# Patient Record
Sex: Male | Born: 1970 | Race: Black or African American | Hispanic: No | Marital: Single | Smoking: Never smoker
Health system: Southern US, Community
[De-identification: ages and names within clinical notes are randomized; demographics above are authoritative.]

---

## 1999-05-09 ENCOUNTER — Emergency Department (HOSPITAL_COMMUNITY): Admission: EM | Admit: 1999-05-09 | Discharge: 1999-05-09 | Payer: Self-pay | Admitting: Emergency Medicine

## 1999-11-07 ENCOUNTER — Encounter: Payer: Self-pay | Admitting: Emergency Medicine

## 1999-11-07 ENCOUNTER — Emergency Department (HOSPITAL_COMMUNITY): Admission: EM | Admit: 1999-11-07 | Discharge: 1999-11-07 | Payer: Self-pay | Admitting: Internal Medicine

## 2004-09-21 ENCOUNTER — Encounter: Admission: RE | Admit: 2004-09-21 | Discharge: 2004-09-21 | Payer: Self-pay | Admitting: Family Medicine

## 2006-04-21 IMAGING — CR DG FOREARM 2V*L*
2 series · 2 of 2 positions shown · non-contrast
Comparison: none

CLINICAL DATA: Patient has left forearm pain.  
 LEFT FOREARM - TWO VIEW:
 AP and lateral views reveal no fracture.

[view not recorded (1 of 2)]
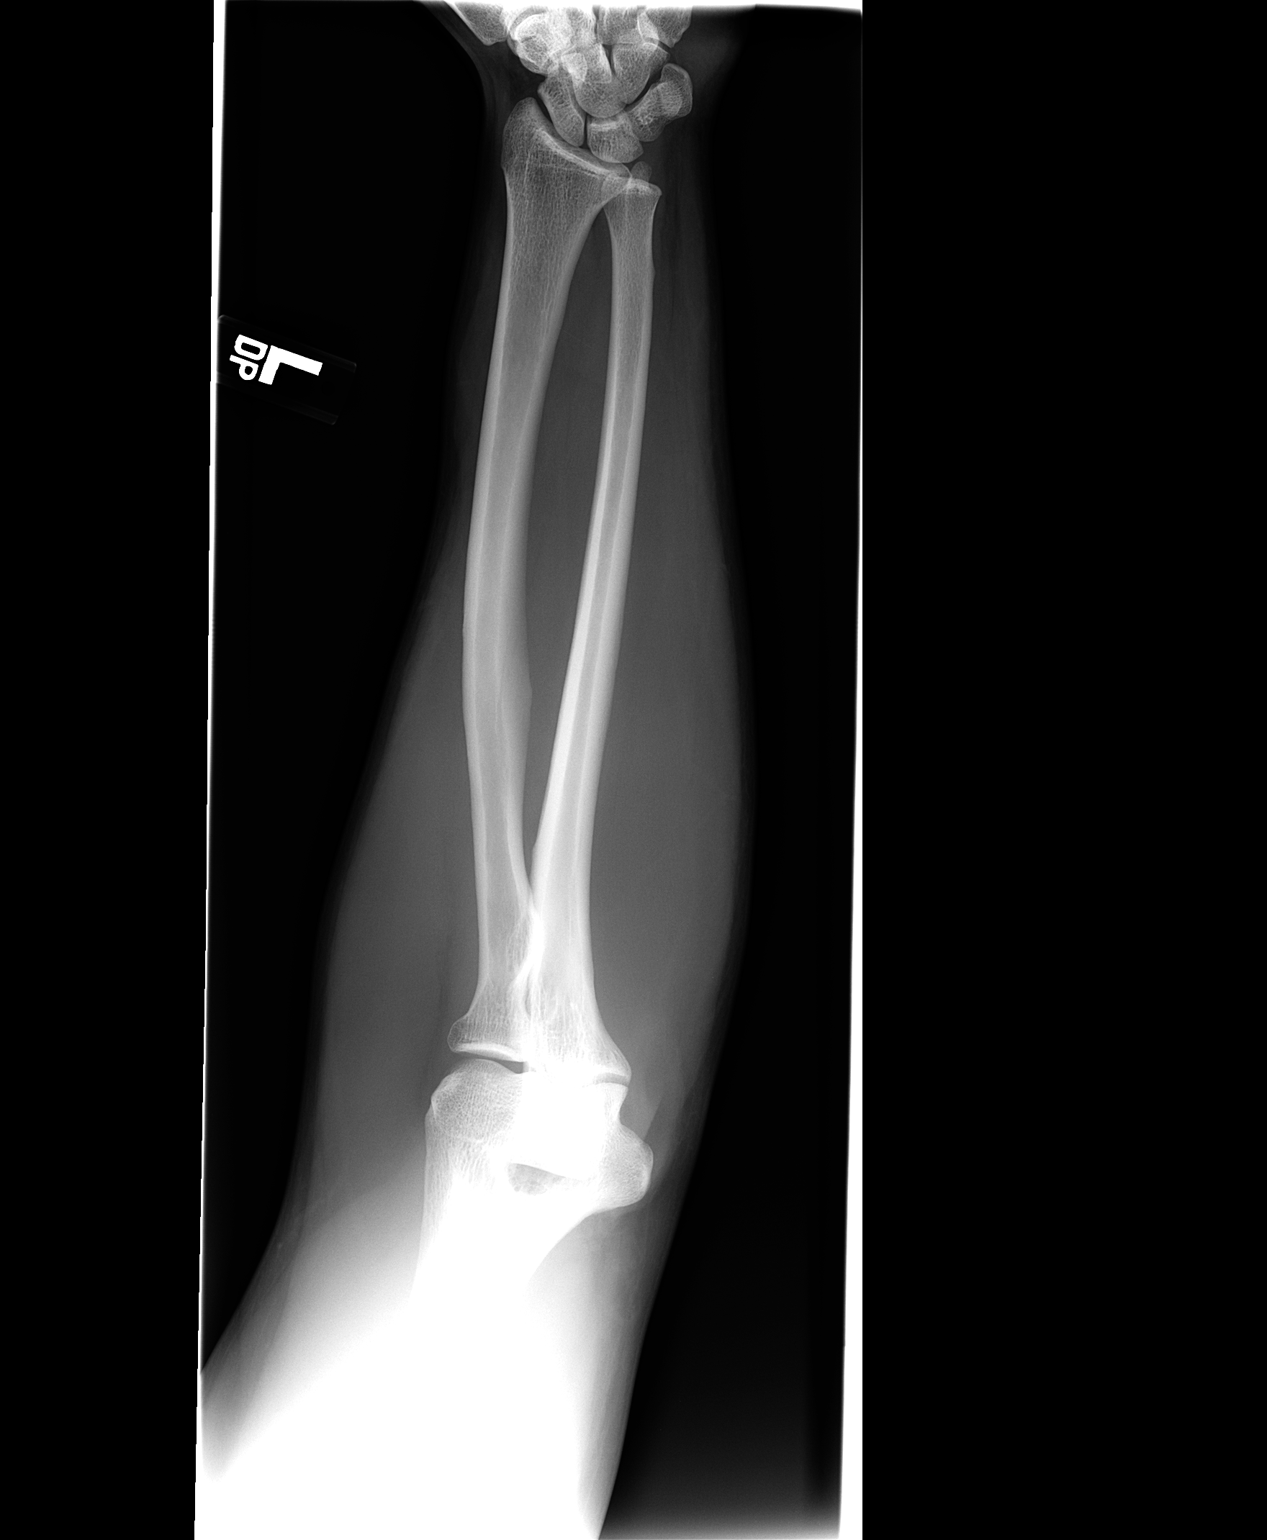

[view not recorded (2 of 2)]
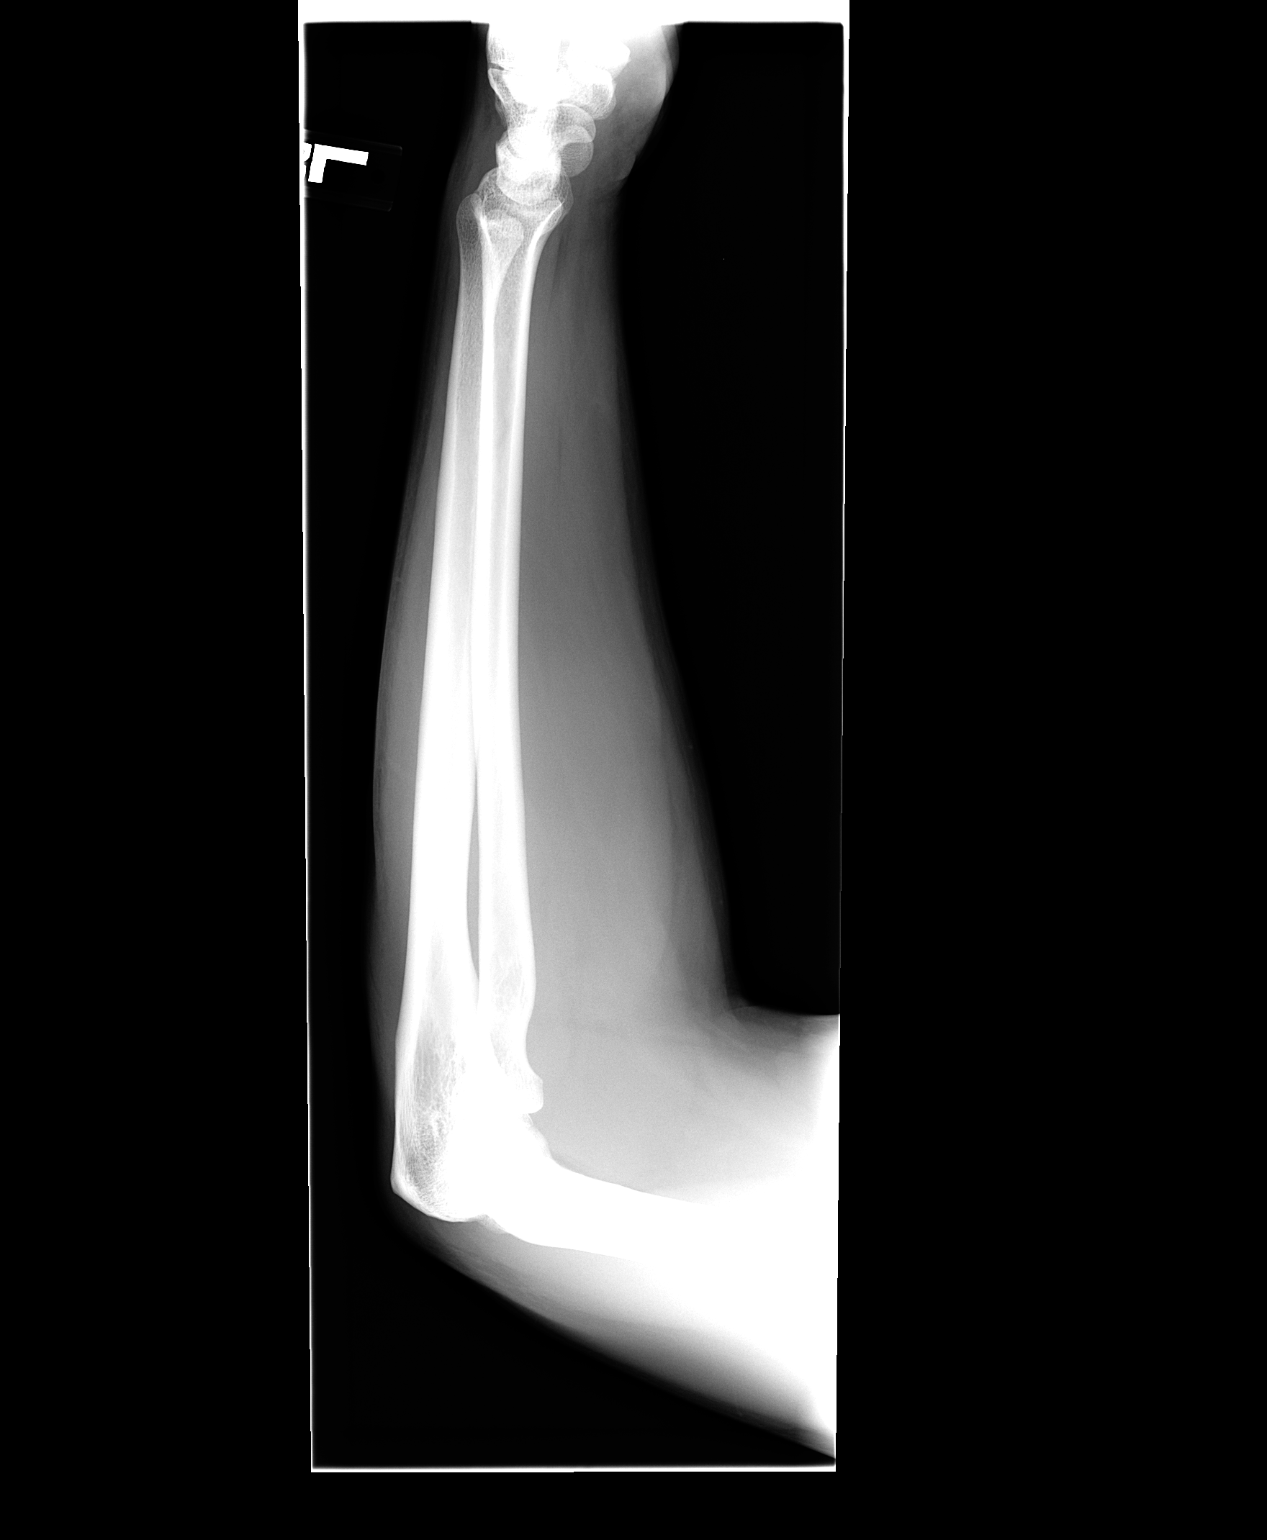

[2 of 2 positions shown; findings below may reference images not displayed]

IMPRESSION: No abnormality.

## 2009-12-19 ENCOUNTER — Encounter
Admission: RE | Admit: 2009-12-19 | Discharge: 2009-12-19 | Payer: Self-pay | Admitting: Physical Medicine and Rehabilitation

## 2011-07-19 IMAGING — CR DG CHEST 1V
1 series · 1 of 1 positions shown · non-contrast
Comparison: None.

CLINICAL DATA: Physical exam

CHEST - 1 VIEW

[w chest pa]
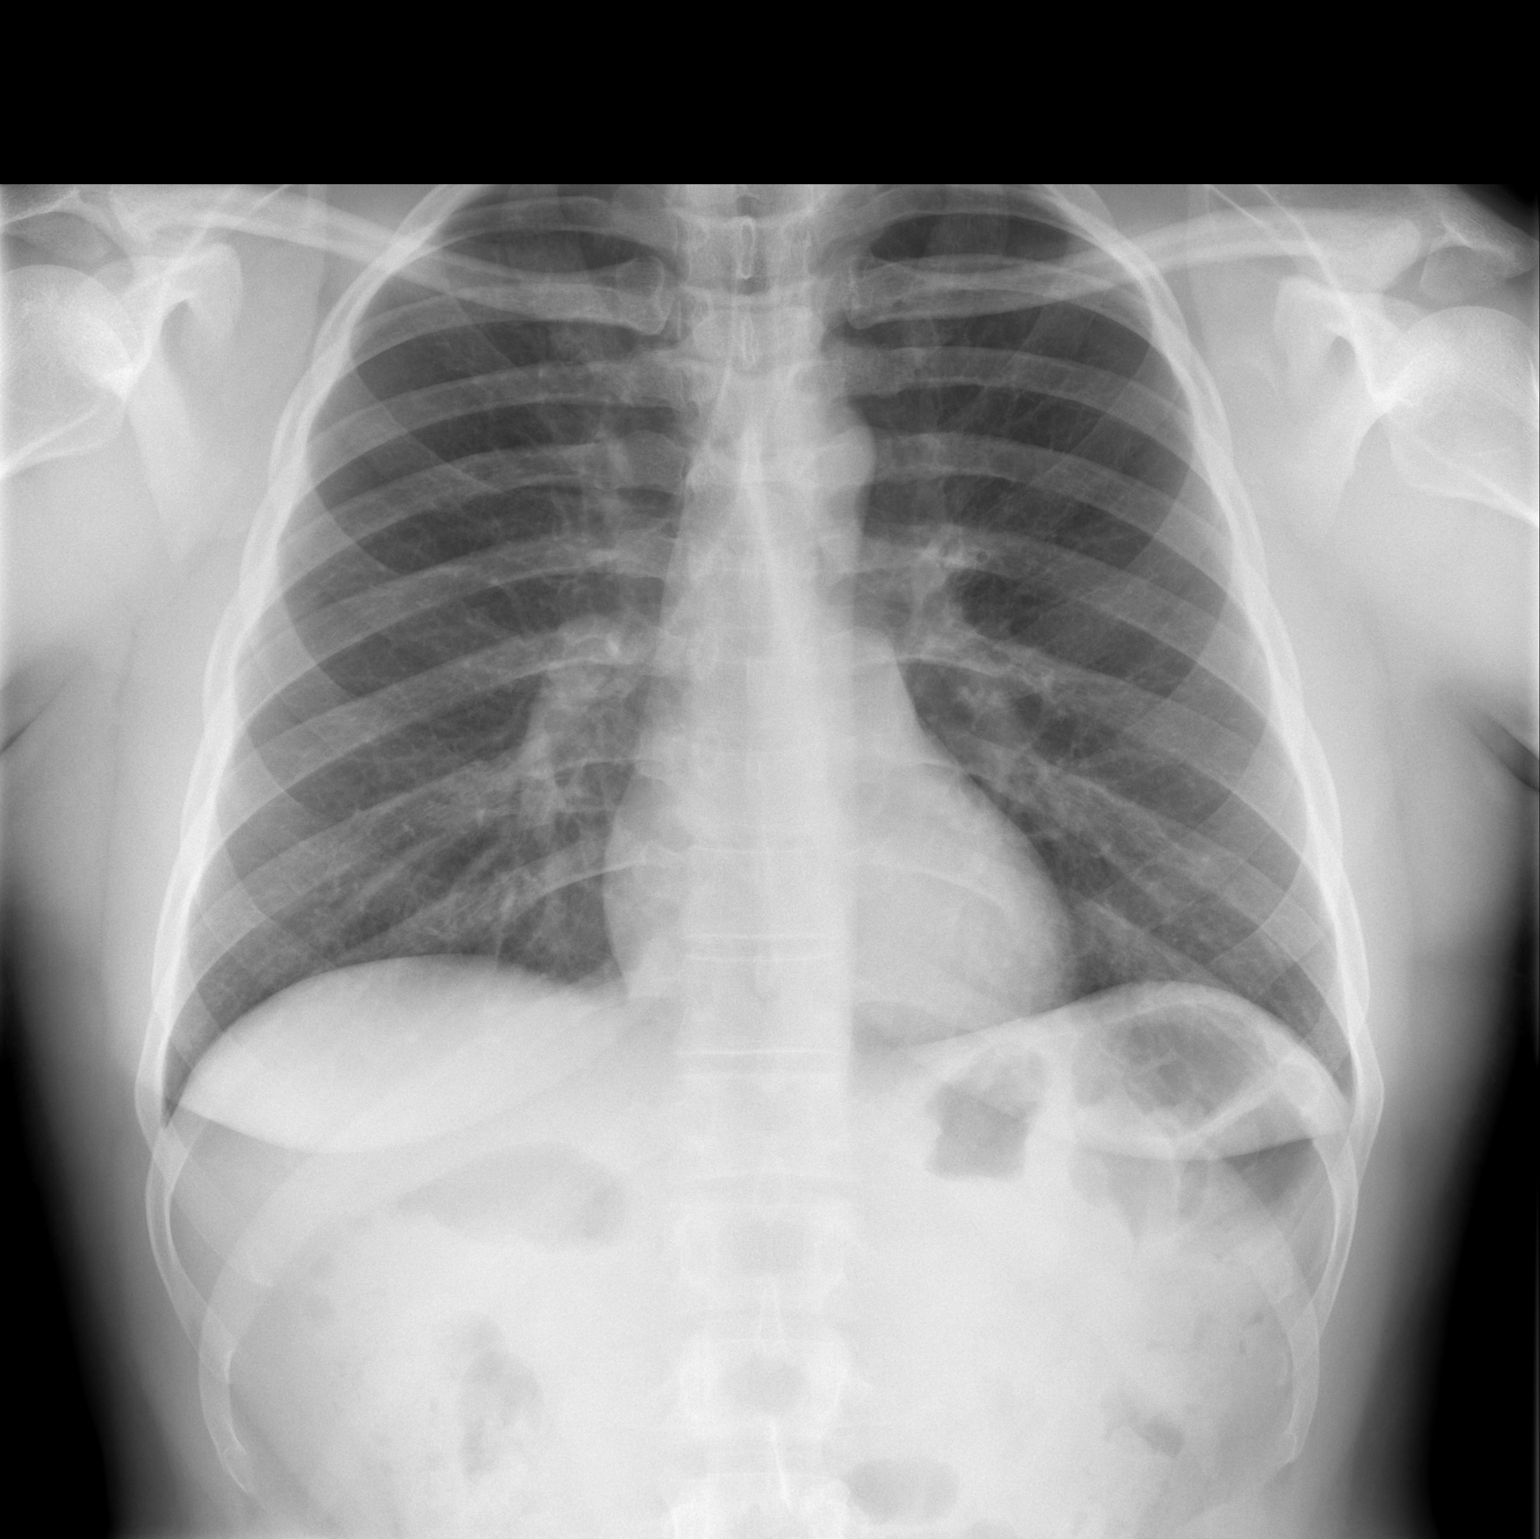

[1 of 1 positions shown; findings below may reference images not displayed]

FINDINGS: The lungs are clear.  Mediastinal contours are normal.
The heart is within normal limits in size.  There is mild
gynecomastia present.  No bony abnormality is seen.
IMPRESSION: No active lung disease.

## 2018-07-29 ENCOUNTER — Ambulatory Visit: Payer: 59 | Admitting: Podiatry

## 2018-08-14 ENCOUNTER — Ambulatory Visit: Payer: 59 | Admitting: Podiatry

## 2018-08-25 ENCOUNTER — Ambulatory Visit: Payer: 59 | Admitting: Podiatry

## 2018-09-25 ENCOUNTER — Ambulatory Visit: Payer: 59 | Admitting: Podiatry

## 2019-01-14 ENCOUNTER — Ambulatory Visit
Admission: EM | Admit: 2019-01-14 | Discharge: 2019-01-14 | Disposition: A | Discharge status: Home / Self Care | Payer: 59 | Attending: Physician Assistant | Admitting: Physician Assistant

## 2019-01-14 DIAGNOSIS — R1032 Left lower quadrant pain: Secondary | ICD-10-CM

## 2019-01-14 DIAGNOSIS — K219 Gastro-esophageal reflux disease without esophagitis: Secondary | ICD-10-CM

## 2019-01-14 DIAGNOSIS — R1031 Right lower quadrant pain: Secondary | ICD-10-CM | POA: Diagnosis not present

## 2019-01-14 DIAGNOSIS — K5900 Constipation, unspecified: Secondary | ICD-10-CM

## 2019-01-14 MED ORDER — POLYETHYLENE GLYCOL 3350 17 GM/SCOOP PO POWD: ORAL | 0 refills | Status: AC

## 2019-01-14 MED ORDER — OMEPRAZOLE 20 MG PO CPDR: 20.0000 mg | DELAYED_RELEASE_CAPSULE | Freq: Every day | ORAL | 0 refills | Status: AC

## 2019-01-14 NOTE — Discharge Instructions (Signed)
No alarming signs on exam at this time. Start omeprazole for acid reflux. Miralax as directed. Keep hydrated, urine should be clear to pale yellow in color.  You can add on over the counter colace if needing further assistant with constipation. If experiencing worsening abdominal pain, vomiting, unwilling to jump up and down due to pain, go to the ED For further evaluation needed.

## 2019-01-14 NOTE — ED Provider Notes (Signed)
[facility]    CSN: [id number] Arrival date & time: 01/14/19  1131     History   Chief Complaint Chief Complaint  Patient presents with  . Abdominal Pain    HPI Chadric Elane FritzO Trammel is a 48 y.o. male.   48 year old male comes in for 2-day history of bilateral lower abdominal pain.  Pain is intermittent, cramping in sensation, no obvious aggravating or alleviating factor.  States when pain is significant, can cause some chills and dizziness.  States has nausea with vomiting/spit up first thing in the morning, but nothing throughout the day.  Has still been able to eat and drink normally.  Denies urinary symptoms such as frequency, dysuria, hematuria.  Does have a few days of constipation.  Denies URI symptoms such as cough, congestion, sore throat.  Denies fever, body aches.  Denies sick contact/COVID contact.  Denies chest pain, shortness of breath, palpitation.  Denies exertional chest pain/fatigue, orthopnea.  Denies personal or family history of heart disease.  Took Motrin with some relief.     History reviewed. No pertinent past medical history.  There are no active problems to display for this patient.   History reviewed. No pertinent surgical history.     Home Medications    Prior to Admission medications   Medication Sig Start Date End Date Taking? Authorizing Provider  omeprazole (PRILOSEC) 20 MG capsule Take 1 capsule (20 mg total) by mouth daily. 01/14/19   Cathie HoopsYu, Amy V, PA-C  polyethylene glycol powder (GLYCOLAX/MIRALAX) 17 GM/SCOOP powder 1 cap full daily with water 01/14/19   Belinda FisherYu, Amy V, PA-C    Family History No family history on file.  Social History Social History   Tobacco Use  . Smoking status: Never Smoker  . Smokeless tobacco: Never Used  Substance Use Topics  . Alcohol use: Not Currently  . Drug use: Not on file     Allergies   Patient has no known allergies.   Review of Systems Review of Systems  Reason unable to perform ROS: See  HPI as above.     Physical Exam Triage Vital Signs ED Triage Vitals [01/14/19 1159]  Enc Vitals Group     BP 122/76     Pulse Rate 94     Resp 18     Temp 98.7 F (37.1 C)     Temp Source Oral     SpO2 96 %     Weight      Height      Head Circumference      Peak Flow      Pain Score 0     Pain Loc      Pain Edu?      Excl. in GC?    No data found.  Updated Vital Signs BP 122/76 (BP Location: Left Arm)   Pulse 94   Temp 98.7 F (37.1 C) (Oral)   Resp 18   SpO2 96%   Physical Exam Constitutional:      General: He is not in acute distress.    Appearance: He is well-developed.  HENT:     Head: Normocephalic and atraumatic.  Cardiovascular:     Rate and Rhythm: Normal rate and regular rhythm.     Heart sounds: Normal heart sounds. No murmur. No friction rub. No gallop.   Pulmonary:     Effort: Pulmonary effort is normal.     Breath sounds: Normal breath sounds. No wheezing or rales.  Abdominal:     General:  Bowel sounds are normal.     Palpations: Abdomen is soft.     Tenderness: There is no abdominal tenderness. There is no right CVA tenderness, left CVA tenderness, guarding or rebound.  Skin:    General: Skin is warm and dry.  Neurological:     Mental Status: He is alert and oriented to person, place, and time.  Psychiatric:        Behavior: Behavior normal.        Judgment: Judgment normal.      UC Treatments / Results  Labs (all labs ordered are listed, but only abnormal results are displayed) Labs Reviewed - No data to display  EKG   Radiology No results found.  Procedures Procedures (including critical care time)  Medications Ordered in UC Medications - No data to display  Initial Impression / Assessment and Plan / UC Course  I have reviewed the triage vital signs and the nursing notes.  Pertinent labs & imaging results that were available during my care of the patient were reviewed by me and considered in my medical decision making  (see chart for details).    No alarming signs at this time.  Will cover for constipation with MiraLAX/Colace.  Given patient only with nausea and spitting up in the morning, will also treat for GERD with omeprazole.  Push fluids.  Return precautions given.  Patient expresses understanding and agrees to plan.  Final Clinical Impressions(s) / UC Diagnoses   Final diagnoses:  Gastroesophageal reflux disease without esophagitis  Constipation, unspecified constipation type  Bilateral lower abdominal pain    ED Prescriptions    Medication Sig Dispense Auth. Provider   polyethylene glycol powder (GLYCOLAX/MIRALAX) 17 GM/SCOOP powder 1 cap full daily with water 255 g Yu, Amy V, PA-C   omeprazole (PRILOSEC) 20 MG capsule Take 1 capsule (20 mg total) by mouth daily. 15 capsule Tobin Chad, [place] 01/14/19 1401

## 2019-01-14 NOTE — ED Triage Notes (Signed)
Pt c/o lower abdominal pain, headache and dizziness x2 days; states has been dealing with heart burn for over 2wks

## 2020-05-02 ENCOUNTER — Ambulatory Visit: Payer: 59 | Admitting: Podiatry

## 2020-05-23 ENCOUNTER — Ambulatory Visit: Payer: 59 | Admitting: Podiatry

## 2020-06-13 ENCOUNTER — Ambulatory Visit: Payer: 59 | Admitting: Podiatry

## 2022-02-06 ENCOUNTER — Other Ambulatory Visit: Payer: Self-pay | Admitting: Family Medicine

## 2022-02-06 ENCOUNTER — Ambulatory Visit
Admission: RE | Admit: 2022-02-06 | Discharge: 2022-02-06 | Disposition: A | Discharge status: Home / Self Care | Payer: 59 | Source: Ambulatory Visit | Attending: Family Medicine | Admitting: Family Medicine

## 2022-02-06 DIAGNOSIS — M79674 Pain in right toe(s): Secondary | ICD-10-CM

## 2022-02-06 DIAGNOSIS — T1490XA Injury, unspecified, initial encounter: Secondary | ICD-10-CM

## 2022-02-09 ENCOUNTER — Ambulatory Visit
Admission: EM | Admit: 2022-02-09 | Discharge: 2022-02-09 | Disposition: A | Discharge status: Home / Self Care | Payer: 59

## 2022-02-09 DIAGNOSIS — M79671 Pain in right foot: Secondary | ICD-10-CM

## 2022-02-09 NOTE — ED Triage Notes (Signed)
Pt c/o right foot injury that occurred last week. Work states needs a provider note.

## 2022-02-09 NOTE — Discharge Instructions (Signed)
Work note has been provided for you.  Please keep scheduled appointment with podiatry.

## 2022-02-09 NOTE — ED Provider Notes (Signed)
[facility]    CSN: [id number] Arrival date & time: 02/09/22  1020      History   Chief Complaint Chief Complaint  Patient presents with   doctor note    HPI Melvin Weber is a 51 y.o. male.   Patient presents with right foot pain after an injury that occurred about 8 days ago.  Patient reports that he was walking through his bedroom when he hit his great toe on bed frame.  He was seen by PCP and had x-rays completed that were negative for any acute bony abnormality.  He was referred to podiatry and has scheduled appointment week for arthritis and hallux valgus examination.  Patient reports he has been taking ibuprofen with minimal improvement for pain. Denies any numbness or tingling. Patient states that he is simply here to get a work note given that he does a lot of prolonged standing at work and PCP is not open on Fridays.     History reviewed. No pertinent past medical history.  There are no problems to display for this patient.   History reviewed. No pertinent surgical history.     Home Medications    Prior to Admission medications   Medication Sig Start Date End Date Taking? Authorizing Provider  omeprazole (PRILOSEC) 20 MG capsule Take 1 capsule (20 mg total) by mouth daily. 01/14/19   Cathie Hoops, Amy V, PA-C  polyethylene glycol powder (GLYCOLAX/MIRALAX) 17 GM/SCOOP powder 1 cap full daily with water 01/14/19   Belinda Fisher, PA-C    Family History History reviewed. No pertinent family history.  Social History Social History   Tobacco Use   Smoking status: Never   Smokeless tobacco: Never  Substance Use Topics   Alcohol use: Not Currently     Allergies   Patient has no known allergies.   Review of Systems Review of Systems Per HPI  Physical Exam Triage Vital Signs ED Triage Vitals [02/09/22 1028]  Enc Vitals Group     BP (!) 147/88     Pulse Rate 87     Resp 18     Temp 98 F (36.7 C)     Temp Source Oral     SpO2 97 %     Weight       Height      Head Circumference      Peak Flow      Pain Score 0     Pain Loc      Pain Edu?      Excl. in GC?    No data found.  Updated Vital Signs BP (!) 147/88 (BP Location: Right Arm)   Pulse 87   Temp 98 F (36.7 C) (Oral)   Resp 18   SpO2 97%   Visual Acuity Right Eye Distance:   Left Eye Distance:   Bilateral Distance:    Right Eye Near:   Left Eye Near:    Bilateral Near:     Physical Exam Constitutional:      General: He is not in acute distress.    Appearance: Normal appearance. He is not toxic-appearing or diaphoretic.  HENT:     Head: Normocephalic and atraumatic.  Eyes:     Extraocular Movements: Extraocular movements intact.     Conjunctiva/sclera: Conjunctivae normal.  Pulmonary:     Effort: Pulmonary effort is normal.  Feet:     Comments: Tenderness to palpation to medial portion of right great toe.  No obvious swelling, discoloration, warmth, lacerations,  abrasions noted.  Patient can wiggle toes and has full range of motion of foot.  Capillary refill and pulses intact.  Patient can bear weight. Neurological:     General: No focal deficit present.     Mental Status: He is alert and oriented to person, place, and time. Mental status is at baseline.  Psychiatric:        Mood and Affect: Mood normal.        Behavior: Behavior normal.        Thought Content: Thought content normal.        Judgment: Judgment normal.      UC Treatments / Results  Labs (all labs ordered are listed, but only abnormal results are displayed) Labs Reviewed - No data to display  EKG   Radiology No results found.  Procedures Procedures (including critical care time)  Medications Ordered in UC Medications - No data to display  Initial Impression / Assessment and Plan / UC Course  I have reviewed the triage vital signs and the nursing notes.  Pertinent labs & imaging results that were available during my care of the patient were reviewed by me and considered  in my medical decision making (see chart for details).     Patient stated that he is simply here to receive a work note.  He had full workup for right foot pain with treatment.  Patient provided with work note.  Do not think that any additional workup is necessary given patient has already been evaluated by PCP and has scheduled appointment with podiatry.  Patient was given strict return precautions.  Patient verbalized understanding and was agreeable with plan. Final Clinical Impressions(s) / UC Diagnoses   Final diagnoses:  Right foot pain     Discharge Instructions      Work note has been provided for you.  Please keep scheduled appointment with podiatry.    ED Prescriptions   None    PDMP not reviewed this encounter.   Gustavus Bryant, [place] 02/09/22 1054

## 2022-02-14 ENCOUNTER — Ambulatory Visit (INDEPENDENT_AMBULATORY_CARE_PROVIDER_SITE_OTHER): Payer: 59

## 2022-02-14 ENCOUNTER — Ambulatory Visit: Payer: 59 | Admitting: Podiatry

## 2022-02-14 ENCOUNTER — Ambulatory Visit: Payer: 59

## 2022-02-14 DIAGNOSIS — M7752 Other enthesopathy of left foot: Secondary | ICD-10-CM

## 2022-02-14 DIAGNOSIS — S92415A Nondisplaced fracture of proximal phalanx of left great toe, initial encounter for closed fracture: Secondary | ICD-10-CM | POA: Diagnosis not present

## 2022-02-16 NOTE — Progress Notes (Signed)
a  Subjective:  Patient ID: Melvin Weber, male    DOB: [birth year],  MRN: [id number]  Chief Complaint  Patient presents with   Toe Pain    (np) right foot pain-near the great toe-Patient jammed foot into bedpost 2 weeks ago, xrays done at [facility]- no fracture seen. Toe/foot is still painful to walk on - all of his shoes are painful to wear    51 y.o. male presents with the above complaint. History confirmed with patient.   Objective:  Physical Exam: warm, good capillary refill, no trophic changes or ulcerative lesions, normal DP and PT pulses, and normal sensory exam. Left Foot:  pain medial 1st MTPJ at base of proximal phalanx, worse with abduction of the hallux   Radiographs: Multiple views x-ray of the left foot: new films taken today and compared to prior films show non displaced base fracture of proximal phalanx medial flare Assessment:   1. Closed nondisplaced fracture of proximal phalanx of left great toe, initial encounter      Plan:  Patient was evaluated and treated and all questions answered.   Reviewed xrays with patient. Recommended non operative treatment. RICE reviewed. He will take OTC NSAIDs. WBAT in fracture boot dispensed today  No follow-ups on file.

## 2022-03-14 ENCOUNTER — Ambulatory Visit: Payer: 59 | Admitting: Podiatry

## 2022-03-14 ENCOUNTER — Ambulatory Visit (INDEPENDENT_AMBULATORY_CARE_PROVIDER_SITE_OTHER): Payer: 59

## 2022-03-14 ENCOUNTER — Encounter: Payer: Self-pay | Admitting: Podiatry

## 2022-03-14 DIAGNOSIS — S92415D Nondisplaced fracture of proximal phalanx of left great toe, subsequent encounter for fracture with routine healing: Secondary | ICD-10-CM

## 2022-03-14 DIAGNOSIS — M21611 Bunion of right foot: Secondary | ICD-10-CM

## 2022-03-14 DIAGNOSIS — M2011 Hallux valgus (acquired), right foot: Secondary | ICD-10-CM | POA: Diagnosis not present

## 2022-03-14 DIAGNOSIS — S92425D Nondisplaced fracture of distal phalanx of left great toe, subsequent encounter for fracture with routine healing: Secondary | ICD-10-CM

## 2022-03-14 NOTE — Progress Notes (Signed)
a  Subjective:  Patient ID: Melvin Weber, male    DOB: [birth year],  MRN: [id number]  Chief Complaint  Patient presents with   Fracture    1 month follow up left great toe fracture    51 y.o. male returns for follow-up with the above complaint. History confirmed with patient.  Right great toe is doing much better.  He is still having some pain under the big toe joint  Objective:  Physical Exam: warm, good capillary refill, no trophic changes or ulcerative lesions, normal DP and PT pulses, and normal sensory exam. Right foot: No pain elevation of the base of the phalanx.  He has hallux valgus deformity with good range of motion, hypermobility noted at the first TMT J  Radiographs: Multiple views x-ray of the right foot: new films taken today and compared to prior films interval healing of fracture site, he has severe hallux valgus deformity, increase in the metatarsal angle.  Significant frontal plane rotation Assessment:   1. Closed nondisplaced fracture of proximal phalanx of left great toe with routine healing, subsequent encounter   2. Hallux valgus with bunions, right   3. Acquired hallux interphalangeus, right      Plan:  Patient was evaluated and treated and all questions answered.  Fracture is healed and doing much better he may resume regular shoe gear and work at this point.  Discussed the etiology and treatment including surgical and non surgical treatment for painful bunions.  He has exhausted all non surgical treatment prior to this visit including shoe gear changes and padding.  He desires surgical intervention. We discussed all risks including but not limited to: pain, swelling, infection, scar, numbness which may be temporary or permanent, chronic pain, stiffness, nerve pain or damage, wound healing problems, bone healing problems including delayed or non-union and recurrence. Specifically we discussed the following procedures: Lapidus arthrodesis, possible Akin  osteotomy, bone graft from the right heel. Informed consent was signed today. Surgery will be scheduled at a mutually agreeable date. Information regarding this will be forwarded to our surgery scheduler.   Surgical plan:  Procedure: -Lapidus arthrodesis, possible Akin osteotomy, bone graft from the right heel  Location: -[facility]  Anesthesia plan: -IV sedation with regional block  Postoperative pain plan: - Tylenol 1000 mg every 6 hours, ibuprofen 600 mg every 6 hours, gabapentin 300 mg every 8 hours x5 days, oxycodone 5 mg 1-2 tabs every 6 hours only as needed  DVT prophylaxis: -None required  WB Restrictions / DME needs: -NWB in CAM boot postop he has this already, will utilize knee scooter  No follow-ups on file.

## 2022-03-15 ENCOUNTER — Telehealth: Payer: Self-pay

## 2022-03-15 NOTE — Telephone Encounter (Signed)
Received surgery paperwork from the [place] office. Left a message for [facility] to call and schedule surgery with Dr. Lilian Kapur.

## 2022-05-02 ENCOUNTER — Telehealth: Payer: Self-pay | Admitting: Podiatry

## 2022-05-02 NOTE — Telephone Encounter (Addendum)
DOS: 06/01/2022  [employer]  Procedures: Lapidus Procedure Including Bunionectomy Rt [phone]), Aiken Osteotomy Rt [phone]), and Bone Graft Rt (20900)  DX: M20.11, M20.41 and M89.771  Deductible: $600 with $0 remaining Out-of-Pocket: $3,000 with $2,245.47 remaining CoInsurance: 0%  Prior Authorization is Required per [facility] website.  Authorization #: [id number]  Authorization Valid: 06/01/2022 - 08/30/2022  Daisy C stated that the authorization was approved and nothing else needed to be done on my end. (Called because authorization on [facility] website says "Your notification/prior authorization update cannot be completed online. Please call the number on the back of the [facility] Medical ID card.)  Call Reference #: [id number]

## 2022-06-01 ENCOUNTER — Other Ambulatory Visit: Payer: Self-pay | Admitting: Podiatry

## 2022-06-01 DIAGNOSIS — M2011 Hallux valgus (acquired), right foot: Secondary | ICD-10-CM | POA: Diagnosis not present

## 2022-06-01 MED ORDER — GABAPENTIN 300 MG PO CAPS: 300.0000 mg | ORAL_CAPSULE | Freq: Three times a day (TID) | ORAL | 0 refills | Status: AC

## 2022-06-01 MED ORDER — IBUPROFEN 600 MG PO TABS: 600.0000 mg | ORAL_TABLET | Freq: Four times a day (QID) | ORAL | 0 refills | Status: AC | PRN

## 2022-06-01 MED ORDER — OXYCODONE HCL 5 MG PO TABS: 5.0000 mg | ORAL_TABLET | ORAL | 0 refills | Status: AC | PRN

## 2022-06-01 MED ORDER — ACETAMINOPHEN 500 MG PO TABS: 1000.0000 mg | ORAL_TABLET | Freq: Four times a day (QID) | ORAL | 0 refills | Status: AC | PRN

## 2022-06-01 NOTE — Progress Notes (Signed)
Bunionectomy of right foot

## 2022-06-07 ENCOUNTER — Ambulatory Visit (INDEPENDENT_AMBULATORY_CARE_PROVIDER_SITE_OTHER): Payer: 59 | Admitting: Podiatry

## 2022-06-07 ENCOUNTER — Ambulatory Visit (INDEPENDENT_AMBULATORY_CARE_PROVIDER_SITE_OTHER): Payer: 59

## 2022-06-07 DIAGNOSIS — M21611 Bunion of right foot: Secondary | ICD-10-CM

## 2022-06-07 DIAGNOSIS — S92425D Nondisplaced fracture of distal phalanx of left great toe, subsequent encounter for fracture with routine healing: Secondary | ICD-10-CM

## 2022-06-07 DIAGNOSIS — M2011 Hallux valgus (acquired), right foot: Secondary | ICD-10-CM | POA: Diagnosis not present

## 2022-06-07 NOTE — Progress Notes (Signed)
  Subjective:  Patient ID: Melvin Weber, male    DOB: March 03, 1971,  MRN: [id number]  Chief Complaint  Patient presents with   Routine Post Op    (xray)POV #1 DOS 06/01/2022 RT FOOT BUNION CORRECTION, BONE GRAFT FROM HEEL, BONE CUT IN BIG TOE     51 y.o. male returns for post-op check.  He is doing well is not having much pain at all  Review of Systems: Negative except as noted in the HPI. Denies N/V/F/Ch.   Objective:  There were no vitals filed for this visit. There is no height or weight on file to calculate BMI. Constitutional Well developed. Well nourished.  Vascular Foot warm and well perfused. Capillary refill normal to all digits.  Calf is soft and supple, no posterior calf or knee pain, negative Homans' sign  Neurologic Normal speech. Oriented to person, place, and time. Epicritic sensation to light touch grossly present bilaterally.  Dermatologic Skin healing well without signs of infection. Skin edges well coapted without signs of infection.  Orthopedic: Tenderness to palpation noted about the surgical site.   Multiple view plain film radiographs: Correction maintained all hardware intact and in appropriate positioning Assessment:   1. Hallux valgus with bunions, right   2. Acquired hallux interphalangeus, right    Plan:  Patient was evaluated and treated and all questions answered.  S/p foot surgery right -Progressing as expected post-operatively. -XR: Noted above good correction noted -WB Status: He has been ambulating on it in the boot.  I discussed with him that I would prefer him to be weightbearing with crutches only to the heel and partial 25% body weight weightbearing.  I demonstrated this for him. -Sutures: Plan to remove in 2 weeks. -Medications: No refills required -Foot redressed.  No follow-ups on file.

## 2022-06-21 ENCOUNTER — Ambulatory Visit (INDEPENDENT_AMBULATORY_CARE_PROVIDER_SITE_OTHER): Payer: 59

## 2022-06-21 VITALS — BP 132/86 | HR 70

## 2022-06-21 DIAGNOSIS — M21611 Bunion of right foot: Secondary | ICD-10-CM

## 2022-06-21 DIAGNOSIS — M2011 Hallux valgus (acquired), right foot: Secondary | ICD-10-CM

## 2022-06-21 NOTE — Progress Notes (Signed)
Patient presents today for post op visit # 2, patient of Dr. Lilian Kapur.   POV # 2 DOS 06/01/22  RT foot bunion correction, bone graft from heel, bone cut in big toe    He presents in his walking boot partial weight-bearing with crutches. Denies any falls or injury to the foot. Foot is swollen. No signs of infection. No calf pain or shortness of breath. Bandages dry and intact. Incision is intact. Sutures were removed without complication. Patient tolerated suture removal well. Advised patient that he can get the incision wet now but do not soak the surgical foot. Patient verbalized understanding.   BP: 132/86 P: 70       Foot redressed today and placed him back in the boot. Reviewed icing and elevation. He will follow up with Dr. Lilian Kapur in 2 weeks for POV# 3 with x-rays.

## 2022-07-05 ENCOUNTER — Ambulatory Visit (INDEPENDENT_AMBULATORY_CARE_PROVIDER_SITE_OTHER): Payer: 59

## 2022-07-05 ENCOUNTER — Ambulatory Visit (INDEPENDENT_AMBULATORY_CARE_PROVIDER_SITE_OTHER): Payer: 59 | Admitting: Podiatry

## 2022-07-05 DIAGNOSIS — M2011 Hallux valgus (acquired), right foot: Secondary | ICD-10-CM

## 2022-07-05 DIAGNOSIS — M21611 Bunion of right foot: Secondary | ICD-10-CM | POA: Diagnosis not present

## 2022-07-09 NOTE — Progress Notes (Signed)
  Subjective:  Patient ID: KI CORBO, male    DOB: 09-29-1970,  MRN: [id number]  Chief Complaint  Patient presents with   Routine Post Op    POV #3 DOS 06/01/2022 RT FOOT BUNION CORRECTION, BONE GRAFT FROM HEEL, BONE CUT IN BIG TOE     52 y.o. male returns for post-op check.  He continues to do well  Review of Systems: Negative except as noted in the HPI. Denies N/V/F/Ch.   Objective:  There were no vitals filed for this visit. There is no height or weight on file to calculate BMI. Constitutional Well developed. Well nourished.  Vascular Foot warm and well perfused. Capillary refill normal to all digits.  Calf is soft and supple, no posterior calf or knee pain, negative Homans' sign  Neurologic Normal speech. Oriented to person, place, and time. Epicritic sensation to light touch grossly present bilaterally.  Dermatologic Incision well-healed not hypertrophic  Orthopedic: Mild edema and tenderness to palpation noted about the surgical site.   Multiple view plain film radiographs: Correction maintained all hardware intact and in appropriate positioning, still some persistent lucency especially in the plantar portions of the fusion site Assessment:   1. Hallux valgus with bunions, right   2. Acquired hallux interphalangeus, right    Plan:  Patient was evaluated and treated and all questions answered.  S/p foot surgery right -Progressing as expected post-operatively. -XR: Noted above good correction noted, still persistent lucency and I recommended another 4 weeks of CAM boot immobilization, transition to a surgical shoe at that point -WB Status: He can be full weightbearing in the boot now   Return in about 4 weeks (around 08/02/2022) for post op (new x-rays).

## 2022-07-12 ENCOUNTER — Encounter: Payer: 59 | Admitting: Podiatry

## 2022-08-08 ENCOUNTER — Ambulatory Visit (INDEPENDENT_AMBULATORY_CARE_PROVIDER_SITE_OTHER): Payer: 59 | Admitting: Podiatry

## 2022-08-08 ENCOUNTER — Ambulatory Visit (INDEPENDENT_AMBULATORY_CARE_PROVIDER_SITE_OTHER): Payer: 59

## 2022-08-08 DIAGNOSIS — M2011 Hallux valgus (acquired), right foot: Secondary | ICD-10-CM | POA: Diagnosis not present

## 2022-08-08 DIAGNOSIS — M21611 Bunion of right foot: Secondary | ICD-10-CM | POA: Diagnosis not present

## 2022-08-12 ENCOUNTER — Encounter: Payer: Self-pay | Admitting: Podiatry

## 2022-08-12 NOTE — Progress Notes (Signed)
  Subjective:  Patient ID: Melvin Weber, male    DOB: Feb 01, 1971,  MRN: [id number]  Chief Complaint  Patient presents with   Routine Post Op    POV #4 DOS 06/01/2022 RT FOOT BUNION CORRECTION, BONE GRAFT FROM HEEL, BONE CUT IN BIG TOE     52 y.o. male returns for post-op check.  He is doing well  Review of Systems: Negative except as noted in the HPI. Denies N/V/F/Ch.   Objective:  There were no vitals filed for this visit. There is no height or weight on file to calculate BMI. Constitutional Well developed. Well nourished.  Vascular Foot warm and well perfused. Capillary refill normal to all digits.  Calf is soft and supple, no posterior calf or knee pain, negative Homans' sign  Neurologic Normal speech. Oriented to person, place, and time. Epicritic sensation to light touch grossly present bilaterally.  Dermatologic Incision well-healed not hypertrophic  Orthopedic: Minimal edema no tenderness today good range of motion of the joint   Multiple view plain film radiographs: Increasing consolidation of the fusion site noted compared to last x-rays Assessment:   1. Hallux valgus with bunions, right   2. Acquired hallux interphalangeus, right    Plan:  Patient was evaluated and treated and all questions answered.  S/p foot surgery right -Overall doing very well shows increasing bone healing on his x-rays today.  May transition to surgical shoe which was dispensed.  I will see him back in 4 weeks for new radiographs and transition back to regular shoe gear.   No follow-ups on file.

## 2022-09-05 ENCOUNTER — Ambulatory Visit (INDEPENDENT_AMBULATORY_CARE_PROVIDER_SITE_OTHER): Payer: 59 | Admitting: Podiatry

## 2022-09-05 ENCOUNTER — Ambulatory Visit (INDEPENDENT_AMBULATORY_CARE_PROVIDER_SITE_OTHER): Payer: 59

## 2022-09-05 DIAGNOSIS — M21611 Bunion of right foot: Secondary | ICD-10-CM

## 2022-09-05 DIAGNOSIS — M960 Pseudarthrosis after fusion or arthrodesis: Secondary | ICD-10-CM

## 2022-09-05 DIAGNOSIS — M2011 Hallux valgus (acquired), right foot: Secondary | ICD-10-CM | POA: Diagnosis not present

## 2022-09-05 NOTE — Progress Notes (Signed)
  Subjective:  Patient ID: Melvin Weber, male    DOB: February 11, 1971,  MRN: [id number]  Chief Complaint  Patient presents with   Routine Post Op    POV #5 DOS 06/01/2022 RT FOOT BUNION CORRECTION, BONE GRAFT FROM HEEL, BONE CUT IN BIG TOE     52 y.o. male returns for post-op check.  He notes continued improvement it is sore when he is on a prolonged period of time  Review of Systems: Negative except as noted in the HPI. Denies N/V/F/Ch.   Objective:  There were no vitals filed for this visit. There is no height or weight on file to calculate BMI. Constitutional Well developed. Well nourished.  Vascular Foot warm and well perfused. Capillary refill normal to all digits.  Calf is soft and supple, no posterior calf or knee pain, negative Homans' sign  Neurologic Normal speech. Oriented to person, place, and time. Epicritic sensation to light touch grossly present bilaterally.  Dermatologic Incision well-healed not hypertrophic  Orthopedic: Minimal edema no tenderness today good range of motion of the joint   Multiple view plain film radiographs: Has continued stability and maintain good correction, there is still persistent lucency at the arthrodesis site Assessment:   1. Hallux valgus with bunions, right   2. Pseudarthrosis after fusion or arthrodesis    Plan:  Patient was evaluated and treated and all questions answered.  S/p foot surgery right -Continues to improve.  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  Referral for this will be sent.  May gradually transition to regular shoe gear.  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  Would like him to be out of work for 1 month.  I will see him back in 6 weeks for new x-rays.   Return in about 6 weeks (around 10/17/2022) for surgery follow up with new xrays.

## 2022-09-06 ENCOUNTER — Encounter: Payer: 59 | Admitting: Podiatry

## 2022-10-17 ENCOUNTER — Ambulatory Visit (INDEPENDENT_AMBULATORY_CARE_PROVIDER_SITE_OTHER): Payer: 59 | Admitting: Podiatry

## 2022-10-17 ENCOUNTER — Ambulatory Visit (INDEPENDENT_AMBULATORY_CARE_PROVIDER_SITE_OTHER): Payer: 59

## 2022-10-17 ENCOUNTER — Encounter: Payer: Self-pay | Admitting: Podiatry

## 2022-10-17 DIAGNOSIS — M2011 Hallux valgus (acquired), right foot: Secondary | ICD-10-CM

## 2022-10-17 DIAGNOSIS — M21611 Bunion of right foot: Secondary | ICD-10-CM

## 2022-10-18 ENCOUNTER — Encounter: Payer: Self-pay | Admitting: Podiatry

## 2022-10-19 NOTE — Progress Notes (Signed)
  Subjective:  Patient ID: Melvin Weber, male    DOB: 1970-12-08,  MRN: [id number]  Chief Complaint  Patient presents with   Bunions    6 week follow up with new xrays     52 y.o. male returns for post-op check.  He does note quite a bit of improvement, he has been using the bone stimulator for about 10 days now, averaging 8 hours a night  Review of Systems: Negative except as noted in the HPI. Denies N/V/F/Ch.   Objective:  There were no vitals filed for this visit. There is no height or weight on file to calculate BMI. Constitutional Well developed. Well nourished.  Vascular Foot warm and well perfused. Capillary refill normal to all digits.  Calf is soft and supple, no posterior calf or knee pain, negative Homans' sign  Neurologic Normal speech. Oriented to person, place, and time. Epicritic sensation to light touch grossly present bilaterally.  Dermatologic Incision well-healed not hypertrophic  Orthopedic: Minimal edema no tenderness today good range of motion of the joint   Multiple view plain film radiographs: Has continued stability and maintain good correction, decreasing lucency noted with interval bridging Assessment:   1. Hallux valgus with bunions, right    Plan:  Patient was evaluated and treated and all questions answered.  S/p foot surgery right -Continues to improve.  Starting to see interval bridging noted.  He would like to return to work which at this point with his stability I think would be fine, he will go back with some restrictions to be able to rest and stay off the foot is much as he can but continue work duties especially if he is able to be seated for his work duties.  Continue utilizing noninvasive bone stimulator.  I advised him would like him to get up to about 10 hours nightly.  I will see him back in 2 months for new radiographs.  Follow-up sooner if he has significant pain swelling edema or tenderness.  Return in about 8 weeks (around  12/11/2022) for surgery follow up (new xrays).

## 2022-12-13 ENCOUNTER — Ambulatory Visit (INDEPENDENT_AMBULATORY_CARE_PROVIDER_SITE_OTHER): Payer: 59 | Admitting: Podiatry

## 2022-12-13 ENCOUNTER — Ambulatory Visit (INDEPENDENT_AMBULATORY_CARE_PROVIDER_SITE_OTHER): Payer: 59

## 2022-12-13 ENCOUNTER — Ambulatory Visit: Payer: 59

## 2022-12-13 DIAGNOSIS — Z9889 Other specified postprocedural states: Secondary | ICD-10-CM | POA: Diagnosis not present

## 2022-12-13 DIAGNOSIS — M960 Pseudarthrosis after fusion or arthrodesis: Secondary | ICD-10-CM | POA: Diagnosis not present

## 2022-12-13 DIAGNOSIS — E559 Vitamin D deficiency, unspecified: Secondary | ICD-10-CM | POA: Diagnosis not present

## 2022-12-13 NOTE — Patient Instructions (Signed)
Call [facility] to schedule your CT at the below locations.  Please allow at least 1 business day after your visit to process the referral.  It may take longer depending on approval from insurance.  Please let me know if you have issues or problems scheduling the CT   [facility] [phone] [address] [place], [place] [address]  [facility] [phone] [address] , [place] [address]  

## 2022-12-16 NOTE — Progress Notes (Signed)
  Subjective:  Patient ID: Melvin Weber, male    DOB: Jul 10, 1970,  MRN: [id number]  Chief Complaint  Patient presents with   Routine Post Op    Right foot bunion follow-up, patient is doing the same as last visit, rate of pain 5 out of 10, X-Rays done today      52 y.o. male returns for post-op check.  He is still using the bone Sumitra, he says it feels about the same since last visit  Review of Systems: Negative except as noted in the HPI. Denies N/V/F/Ch.   Objective:  There were no vitals filed for this visit. There is no height or weight on file to calculate BMI. Constitutional Well developed. Well nourished.  Vascular Foot warm and well perfused. Capillary refill normal to all digits.  Calf is soft and supple, no posterior calf or knee pain, negative Homans' sign  Neurologic Normal speech. Oriented to person, place, and time. Epicritic sensation to light touch grossly present bilaterally.  Dermatologic Incision well-healed not hypertrophic  Orthopedic: Still has some edema and tenderness in the plantar first TMT   Multiple view plain film radiographs:, Lucency appears to be more significant than the last visit, new likely fracture at the runout of the screw of the intercuneiform screw, some loss of correction in sagittal and transverse Assessment:   1. Vitamin D deficiency   2. Pseudarthrosis after fusion or arthrodesis    Plan:  Patient was evaluated and treated and all questions answered.  S/p foot surgery right -Unfortunate now shows gross nonunion and fracture of hardware with loss of correction.  I recommended evaluating with lab work including parathyroid hormone, metabolic panel and a vitamin D level.  CT scan ordered to evaluate if there is any percentage of fusion.  I discussed with him likely this is going to need surgical revision with bone graft and bone marrow aspirate.  I will see him back after he completes the lab work and CT scan.  Return for after CT to  review, after lab work to review.

## 2022-12-20 ENCOUNTER — Telehealth: Payer: Self-pay | Admitting: Podiatry

## 2022-12-20 NOTE — Telephone Encounter (Signed)
error 

## 2022-12-26 ENCOUNTER — Encounter: Payer: Self-pay | Admitting: Podiatry

## 2023-01-01 LAB — COMPREHENSIVE METABOLIC PANEL
ALT: 28 IU/L (ref 0–44)
AST: 17 IU/L (ref 0–40)
Albumin: 4.4 g/dL (ref 3.8–4.9)
Alkaline Phosphatase: 85 IU/L (ref 44–121)
BUN/Creatinine Ratio: 14 (ref 9–20)
BUN: 17 mg/dL (ref 6–24)
Bilirubin Total: 0.5 mg/dL (ref 0.0–1.2)
CO2: 22 mmol/L (ref 20–29)
Calcium: 9.5 mg/dL (ref 8.7–10.2)
Chloride: 104 mmol/L (ref 96–106)
Creatinine, Ser: 1.18 mg/dL (ref 0.76–1.27)
Globulin, Total: 2.9 g/dL (ref 1.5–4.5)
Glucose: 88 mg/dL (ref 70–99)
Potassium: 4.8 mmol/L (ref 3.5–5.2)
Sodium: 138 mmol/L (ref 134–144)
Total Protein: 7.3 g/dL (ref 6.0–8.5)
eGFR: 75 mL/min/{1.73_m2} (ref >59)

## 2023-01-01 LAB — VITAMIN D 25 HYDROXY (VIT D DEFICIENCY, FRACTURES): Vit D, 25-Hydroxy: 14.7 ng/mL — ABNORMAL LOW (ref 30.0–100.0)

## 2023-01-01 LAB — PARATHYROID HORMONE, INTACT (NO CA): PTH: 37 pg/mL (ref 15–65)

## 2023-01-10 ENCOUNTER — Ambulatory Visit
Admission: RE | Admit: 2023-01-10 | Discharge: 2023-01-10 | Disposition: A | Discharge status: Home / Self Care | Payer: 59 | Source: Ambulatory Visit | Attending: Podiatry | Admitting: Podiatry

## 2023-01-10 DIAGNOSIS — M960 Pseudarthrosis after fusion or arthrodesis: Secondary | ICD-10-CM

## 2023-01-10 DIAGNOSIS — E559 Vitamin D deficiency, unspecified: Secondary | ICD-10-CM

## 2023-01-21 ENCOUNTER — Other Ambulatory Visit: Payer: Self-pay | Admitting: Podiatry

## 2023-01-21 MED ORDER — VITAMIN D (ERGOCALCIFEROL) 1.25 MG (50000 UNIT) PO CAPS: 50000.0000 [IU] | ORAL_CAPSULE | ORAL | 0 refills | Status: AC

## 2023-01-28 ENCOUNTER — Ambulatory Visit (INDEPENDENT_AMBULATORY_CARE_PROVIDER_SITE_OTHER): Payer: 59

## 2023-01-28 ENCOUNTER — Ambulatory Visit (INDEPENDENT_AMBULATORY_CARE_PROVIDER_SITE_OTHER): Payer: 59 | Admitting: Podiatry

## 2023-01-28 ENCOUNTER — Encounter: Payer: Self-pay | Admitting: Podiatry

## 2023-01-28 DIAGNOSIS — E559 Vitamin D deficiency, unspecified: Secondary | ICD-10-CM | POA: Diagnosis not present

## 2023-01-28 DIAGNOSIS — M960 Pseudarthrosis after fusion or arthrodesis: Secondary | ICD-10-CM

## 2023-01-28 NOTE — Progress Notes (Signed)
  Subjective:  Patient ID: Melvin Weber, male    DOB: 12-21-1970,  MRN: [id number]  Follow-up after CT scan   52 y.o. male returns for post-op check.  Symptoms are about the same.  He completed the CT scan he is taking the vitamin D that I prescribed him  Review of Systems: Negative except as noted in the HPI. Denies N/V/F/Ch.   Objective:  There were no vitals filed for this visit. There is no height or weight on file to calculate BMI. Constitutional Well developed. Well nourished.  Vascular Foot warm and well perfused. Capillary refill normal to all digits.  Calf is soft and supple, no posterior calf or knee pain, negative Homans' sign  Neurologic Normal speech. Oriented to person, place, and time. Epicritic sensation to light touch grossly present bilaterally.  Dermatologic Incision well-healed not hypertrophic  Orthopedic: Still has some edema and tenderness in the plantar first TMT, he has good range of motion of the first MTPJ, no pain here   Multiple view plain film radiographs: New films taken today show unchanged alignment or hardware position, no fracture of hardware  MPRESSION: 1. Extensive midfoot fusion hardware with plate and screw fixation at the first TMT joint. 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 2. 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 3. No acute bony findings.     Electronically Signed   By: Rudie Meyer M.D.   On: 01/18/2023 19:09  Vitamin D 14.7 Assessment:   1. Pseudarthrosis after fusion or arthrodesis   2. Vitamin D deficiency    Plan:  Patient was evaluated and treated and all questions answered.  S/p foot surgery right -Reviewed the results of his CT, he does have some areas of osseous fusion, considering the percentage of fusion that is occurring and lack of pain at the  first MPJ, I recommend we continue to utilize a bone stimulator as well as a vitamin D supplementation to see if this allows for complete union.  I will see him back in 2 months for new radiographs.  If he achieves full union by that point we can transition him back to work  Return in about 2 months (around 03/31/2023) for new right foot xrays.

## 2023-04-02 ENCOUNTER — Ambulatory Visit: Payer: 59 | Admitting: Podiatry

## 2023-04-02 ENCOUNTER — Ambulatory Visit (INDEPENDENT_AMBULATORY_CARE_PROVIDER_SITE_OTHER): Payer: 59

## 2023-04-02 ENCOUNTER — Encounter: Payer: Self-pay | Admitting: Podiatry

## 2023-04-02 DIAGNOSIS — M2011 Hallux valgus (acquired), right foot: Secondary | ICD-10-CM | POA: Diagnosis not present

## 2023-04-02 DIAGNOSIS — M21611 Bunion of right foot: Secondary | ICD-10-CM | POA: Diagnosis not present

## 2023-04-03 NOTE — Progress Notes (Signed)
  Subjective:  Patient ID: Melvin Weber, male    DOB: 08-06-1970,  MRN: [id number]  Follow-up after CT scan   52 y.o. male returns for post-op check.  He is doing well not having any pain  Review of Systems: Negative except as noted in the HPI. Denies N/V/F/Ch.   Objective:  There were no vitals filed for this visit. There is no height or weight on file to calculate BMI. Constitutional Well developed. Well nourished.  Vascular Foot warm and well perfused. Capillary refill normal to all digits.  Calf is soft and supple, no posterior calf or knee pain, negative Homans' sign  Neurologic Normal speech. Oriented to person, place, and time. Epicritic sensation to light touch grossly present bilaterally.  Dermatologic Incision well-healed not hypertrophic  Orthopedic: Today he has no pain or swelling correction is maintained   Multiple view plain film radiographs: New films taken today show unchanged alignment or hardware position, no fracture of hardware, good consolidation across fusion site  MPRESSION: 1. Extensive midfoot fusion hardware with plate and screw fixation at the first TMT joint. 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 2. 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 3. No acute bony findings.     Electronically Signed   By: Rudie Meyer M.D.   On: 01/18/2023 19:09  Vitamin D 14.7 Assessment:   1. Hallux valgus with bunions, right    Plan:  Patient was evaluated and treated and all questions answered.  S/p foot surgery right -Reviewed the results of his new radiographs today.  There is good osseous fusion across his arthrodesis site.  He does not have any pain or significant swelling.  Overall his correction is maintained in his foot is doing well and he is ambulating in regular shoe gear.  He is ready  to go back to work.  I released him back to work without restrictions.  He will follow-up with me as needed if he has further issues with this foot or other issues. Return if symptoms worsen or fail to improve.
